# Patient Record
Sex: Male | Born: 2000 | Race: Black or African American | Hispanic: No | Marital: Single | Smoking: Never smoker
Health system: Southern US, Community
[De-identification: ages and names within clinical notes are randomized; demographics above are authoritative.]

## PROBLEM LIST (undated history)

## (undated) NOTE — *Deleted (*Deleted)
[facility] - Clinic Note  11/10/2020     CHIEF COMPLAINT Patient presents for No chief complaint on file.   HISTORY OF PRESENT ILLNESS: Keith Meyer is a 65 y.o. male who presents to the clinic today for:     Referring physician: No referring provider defined for this encounter.  HISTORICAL INFORMATION:   Selected notes from the MEDICAL RECORD NUMBER Referred by Dr. Louis Meckel for decreased vision LEE:  Ocular Hx- PMH-    CURRENT MEDICATIONS: No current outpatient medications on file. (Ophthalmic Drugs)   No current facility-administered medications for this visit. (Ophthalmic Drugs)   Current Outpatient Medications (Other)  Medication Sig  . ibuprofen (ADVIL,MOTRIN) 100 MG/5ML suspension Take 24.6 mLs (492 mg total) by mouth every 6 (six) hours as needed for fever or mild pain.   No current facility-administered medications for this visit. (Other)      REVIEW OF SYSTEMS:    ALLERGIES No Known Allergies  PAST MEDICAL HISTORY No past medical history on file. No past surgical history on file.  FAMILY HISTORY No family history on file.  SOCIAL HISTORY Social History   Tobacco Use  . Smoking status: Never Smoker  Substance Use Topics  . Alcohol use: No  . Drug use: No         OPHTHALMIC EXAM: Not recorded     IMAGING AND PROCEDURES  Imaging and Procedures for 11/10/2020           ASSESSMENT/PLAN:    ICD-10-CM   1. Retinal edema  H35.81     1.  2.  3.  Ophthalmic Meds Ordered this visit:  No orders of the defined types were placed in this encounter.      No follow-ups on file.  There are no Patient Instructions on file for this visit.   Explained the diagnoses, plan, and follow up with the patient and they expressed understanding.  Patient expressed understanding of the importance of proper follow up care.   Karie Chimera, M.D., Ph.D. Diseases & Surgery of the Retina and Vitreous [facility] @TODAY @     Abbreviations: M myopia (nearsighted); A astigmatism; H hyperopia (farsighted); P presbyopia; Mrx spectacle prescription;  CTL contact lenses; OD right eye; OS left eye; OU both eyes  XT exotropia; ET esotropia; PEK punctate epithelial keratitis; PEE punctate epithelial erosions; DES dry eye syndrome; MGD meibomian gland dysfunction; ATs artificial tears; PFAT's preservative free artificial tears; NSC nuclear sclerotic cataract; PSC posterior subcapsular cataract; ERM epi-retinal membrane; PVD posterior vitreous detachment; RD retinal detachment; DM diabetes mellitus; DR diabetic retinopathy; NPDR non-proliferative diabetic retinopathy; PDR proliferative diabetic retinopathy; CSME clinically significant macular edema; DME diabetic macular edema; dbh dot blot hemorrhages; CWS cotton wool spot; POAG primary open angle glaucoma; C/D cup-to-disc ratio; HVF humphrey visual field; GVF goldmann visual field; OCT optical coherence tomography; IOP intraocular pressure; BRVO Branch retinal vein occlusion; CRVO central retinal vein occlusion; CRAO central retinal artery occlusion; BRAO branch retinal artery occlusion; RT retinal tear; SB scleral buckle; PPV pars plana vitrectomy; VH Vitreous hemorrhage; PRP panretinal laser photocoagulation; IVK intravitreal kenalog; VMT vitreomacular traction; MH Macular hole;  NVD neovascularization of the disc; NVE neovascularization elsewhere; AREDS age related eye disease study; ARMD age related macular degeneration; POAG primary open angle glaucoma; EBMD epithelial/anterior basement membrane dystrophy; ACIOL anterior chamber intraocular lens; IOL intraocular lens; PCIOL posterior chamber intraocular lens; Phaco/IOL phacoemulsification with intraocular lens placement; PRK photorefractive keratectomy; LASIK laser assisted in situ keratomileusis;  HTN hypertension; DM diabetes mellitus; COPD chronic obstructive pulmonary disease.

---
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
## 2021-11-07 ENCOUNTER — Ambulatory Visit (HOSPITAL_COMMUNITY)
Admission: EM | Admit: 2021-11-07 | Discharge: 2021-11-07 | Disposition: A | Discharge status: Home / Self Care | Payer: Self-pay | Attending: Emergency Medicine | Admitting: Emergency Medicine

## 2021-11-07 ENCOUNTER — Encounter (HOSPITAL_COMMUNITY): Payer: Self-pay

## 2021-11-07 ENCOUNTER — Other Ambulatory Visit: Payer: Self-pay

## 2021-11-07 DIAGNOSIS — K2100 Gastro-esophageal reflux disease with esophagitis, without bleeding: Secondary | ICD-10-CM

## 2021-11-07 DIAGNOSIS — K2950 Unspecified chronic gastritis without bleeding: Secondary | ICD-10-CM

## 2021-11-07 DIAGNOSIS — J069 Acute upper respiratory infection, unspecified: Secondary | ICD-10-CM

## 2021-11-07 DIAGNOSIS — R0981 Nasal congestion: Secondary | ICD-10-CM

## 2021-11-07 LAB — POC INFLUENZA A AND B ANTIGEN (URGENT CARE ONLY)
INFLUENZA A ANTIGEN, POC: NEGATIVE
INFLUENZA B ANTIGEN, POC: NEGATIVE

## 2021-11-07 MED ORDER — LANSOPRAZOLE 30 MG PO CPDR: 30.0000 mg | DELAYED_RELEASE_CAPSULE | Freq: Every day | ORAL | 2 refills | Status: AC

## 2021-11-07 NOTE — ED Provider Notes (Signed)
[facility]    CSN: [id number] Arrival date & time: 11/07/21  1612   History   Chief Complaint Chief Complaint  Patient presents with   Nasal Congestion        HPI Keith Meyer is a 20 y.o. male. Patient complains of nasal congestion and chest congestion for the past 24 hours.  Patient states that several people in his household are also sick.  Patient also states that his lungs feel "scratchy" when he takes a deep breath.  Patient states he has experienced this sensation on and off for the past year.  Patient denies known history of allergies and asthma.  Patient also states that when he takes a very deep breath, he has significant amount of pain in his epigastric area, patient points to this area for demonstration.  Patient states is usually worse when he has large meal or eats something like pizza or goes to bed too soon after eating.  Patient states he is also noticed that at nighttime sometimes he has a cough which will wake him from sleep, states he feels like he is choking when this happens, states sometimes he needs to sit up for several hours to wait for that discomfort in his epigastric area to go away.  Patient states the cough is not productive of sputum.  Temp on arrival today is 52 F.  The history is provided by the patient.   History reviewed. No pertinent past medical history. There are no problems to display for this patient.  History reviewed. No pertinent surgical history.  Home Medications    Prior to Admission medications   Medication Sig Start Date End Date Taking? Authorizing Provider  lansoprazole (PREVACID) 30 MG capsule Take 1 capsule (30 mg total) by mouth daily with breakfast. 11/07/21 02/05/22 Yes Theadora Rama Scales, PA-C  ibuprofen (ADVIL,MOTRIN) 100 MG/5ML suspension Take 24.6 mLs (492 mg total) by mouth every 6 (six) hours as needed for fever or mild pain. 05/31/15   [name], MD   Family History History reviewed. No pertinent  family history. Social History Social History   Tobacco Use   Smoking status: Never   Smokeless tobacco: Never  Substance Use Topics   Alcohol use: No   Drug use: No    Comment: last time 2021   Allergies   Patient has no known allergies.  Review of Systems Review of Systems Pertinent findings noted in history of present illness.   Physical Exam Triage Vital Signs ED Triage Vitals  Enc Vitals Group     BP 10/23/21 0827 (!) 147/82     Pulse Rate 10/23/21 0827 72     Resp 10/23/21 0827 18     Temp 10/23/21 0827 98.3 F (36.8 C)     Temp Source 10/23/21 0827 Oral     SpO2 10/23/21 0827 98 %     Weight --      Height --      Head Circumference --      Peak Flow --      Pain Score 10/23/21 0826 5     Pain Loc --      Pain Edu? --      Excl. in GC? --    No data found.  Updated Vital Signs BP 128/72 (BP Location: Right Arm)   Pulse 91   Temp 99 F (37.2 C) (Oral)   Resp 17   SpO2 99%   Visual Acuity Right Eye Distance:   Left Eye Distance:  Bilateral Distance:    Right Eye Near:   Left Eye Near:    Bilateral Near:     Physical Exam Vitals and nursing note reviewed.  Constitutional:      General: He is not in acute distress.    Appearance: Normal appearance. He is not ill-appearing.  HENT:     Head: Normocephalic and atraumatic.     Salivary Glands: Right salivary gland is not diffusely enlarged or tender. Left salivary gland is not diffusely enlarged or tender.     Right Ear: Tympanic membrane, ear canal and external ear normal. No drainage. No middle ear effusion. There is no impacted cerumen. Tympanic membrane is not erythematous or bulging.     Left Ear: Tympanic membrane, ear canal and external ear normal. No drainage.  No middle ear effusion. There is no impacted cerumen. Tympanic membrane is not erythematous or bulging.     Nose: Nose normal. No nasal deformity, septal deviation, mucosal edema, congestion or rhinorrhea.     Right Turbinates: Not  enlarged, swollen or pale.     Left Turbinates: Not enlarged, swollen or pale.     Right Sinus: No maxillary sinus tenderness or frontal sinus tenderness.     Left Sinus: No maxillary sinus tenderness or frontal sinus tenderness.     Mouth/Throat:     Lips: Pink. No lesions.     Mouth: Mucous membranes are moist. No oral lesions.     Pharynx: Oropharynx is clear. Uvula midline. No posterior oropharyngeal erythema or uvula swelling.     Tonsils: No tonsillar exudate. 0 on the right. 0 on the left.  Eyes:     General: Lids are normal.        Right eye: No discharge.        Left eye: No discharge.     Extraocular Movements: Extraocular movements intact.     Conjunctiva/sclera: Conjunctivae normal.     Right eye: Right conjunctiva is not injected.     Left eye: Left conjunctiva is not injected.  Neck:     Trachea: Trachea and phonation normal.  Cardiovascular:     Rate and Rhythm: Normal rate and regular rhythm.     Pulses: Normal pulses.     Heart sounds: Normal heart sounds. No murmur heard.   No friction rub. No gallop.  Pulmonary:     Effort: Pulmonary effort is normal. No accessory muscle usage, prolonged expiration or respiratory distress.     Breath sounds: Normal breath sounds. No stridor, decreased air movement or transmitted upper airway sounds. No decreased breath sounds, wheezing, rhonchi or rales.  Chest:     Chest wall: No tenderness.  Abdominal:     General: Abdomen is flat. Bowel sounds are normal. There is no distension.     Palpations: Abdomen is soft.     Tenderness: There is abdominal tenderness in the epigastric area. There is no right CVA tenderness or left CVA tenderness.     Hernia: No hernia is present.  Musculoskeletal:        General: Normal range of motion.     Cervical back: Normal range of motion and neck supple. Normal range of motion.  Lymphadenopathy:     Cervical: No cervical adenopathy.  Skin:    General: Skin is warm and dry.     Findings: No  erythema or rash.  Neurological:     General: No focal deficit present.     Mental Status: He is alert and oriented to person,  place, and time.  Psychiatric:        Mood and Affect: Mood normal.        Behavior: Behavior normal.   UC Treatments / Results  Labs (all labs ordered are listed, but only abnormal results are displayed)  Labs Reviewed  POC INFLUENZA A AND B ANTIGEN (URGENT CARE ONLY)    EKG  Radiology No results found.  Procedures Procedures (including critical care time)  Medications Ordered in UC Medications - No data to display  Initial Impression / Assessment and Plan / UC Course  I have reviewed the triage vital signs and the nursing notes.  Pertinent labs & imaging results that were available during my care of the patient were reviewed by me and considered in my medical decision making (see chart for details).      Rapid flu test today is negative, patient advised.  Believe patient is suffering from undiagnosed gastroesophageal reflux disease with possible peptic ulcer disease.  I have advised him to begin taking Prevacid daily.  I have also initiated a request to help him find a primary care provider.  Patient may also be suffering from a mild upper respiratory infection, I provided him with a list of medications that he can try to treat himself symptomatically.   Final Clinical Impressions(s) / UC Diagnoses   Final diagnoses:  Nasal congestion  Chronic gastritis, presence of bleeding unspecified, unspecified gastritis type  Gastroesophageal reflux disease with esophagitis, unspecified whether hemorrhage  Viral upper respiratory tract infection     Discharge Instructions      For your chronic heartburn symptoms, please begin Prevacid once daily.  This medication works best to be taken every day and not just as needed.  The purpose of this drug to suppress acid production, allow the lining of your stomach to heal, once it is healed she should no longer  have any pain.  Recommend that you continue taking it daily for the next 3 months then you can discontinue.  If your symptoms return, then you will know that this is a medication you will need to be on long-term.  Your influenza test today is negative.  You are suffering from a viral upper respiratory infection.  Listed below are medications that you could try to help relieve your symptoms.  Please continue to avoid being around others while you are still feeling sick.  Please follow-up with your primary care provider or with urgent care in the next 3 to 5 days if you are not feeling better or if her symptoms begin to worsen.  Your symptoms are most consistent with a viral upper respiratory illness.  Conservative care is recommended at this time.  This includes rest, pushing clear fluids and activity as tolerated.  You may also noticed that your child's appetite is reduced, this is okay as long as they are drinking plenty of clear fluids.  Acetaminophen (Tylenol): This is a good fever reducer.  If there body temperature rises above 101.5 as measured with a thermometer, it is recommended that you give them 1,000 mg every 6-8 hours until they are temperature falls below 101.5, please not take more than 3,000 mg of acetaminophen either as a separate medication or as in ingredient in an over-the-counter cold/flu preparation within a 24-hour period  Ibuprofen  (Advil, Motrin): This is a good anti-inflammatory medication which addresses aches and pains and, to some degree, congestion in the nasal passages.  I recommend giving between 400 to 600 mg every 6-8 hours  as needed.  Pseudoephedrine (Sudafed): This is a decongestant.  This medication has to be purchased from the pharmacist counter, I recommend giving 2 tablets, 60 mg, 2-3 times a day as needed to relieve runny nose and sinus drainage.  Guaifenesin (Robitussin, Mucinex): This is an expectorant.  This helps break up chest congestion and loosen up thick  nasal drainage making phlegm and drainage more liquid and therefore easier to remove.  I recommend being 400 mg three times daily as needed.  Dextromethorphan (any cough medicine with the letters "DM" added to it's name such as Robitussin DM): This is a cough suppressant.  This is often recommended to be taken at nighttime to suppress cough and help children sleep.  Give dosage as directed on the bottle.   Chloraseptic Throat Spray: Spray 5 sprays into affected area every 2 hours, hold for 15 seconds and either swallow or spit it out.  This is a excellent numbing medication because it is a spray, you can put it right where you needed and so sucking on a lozenge and numbing your entire mouth.  Please follow-up within the next 3 to 5 days either with your primary care provider or urgent care if their symptoms do not resolve.  If you do not have a primary care provider, we will assist you in finding one.      ED Prescriptions     Medication Sig Dispense Auth. Provider   lansoprazole (PREVACID) 30 MG capsule Take 1 capsule (30 mg total) by mouth daily with breakfast. 30 capsule Theadora Rama Scales, PA-C      PDMP not reviewed this encounter.  Disposition Upon Discharge:  Patient presented with an acute illness with associated systemic symptoms and significant discomfort requiring urgent management. In my opinion, this is a condition that a prudent lay person (someone who possesses an average knowledge of health and medicine) may potentially expect to result in complications if not addressed urgently such as respiratory distress, impairment of bodily function or dysfunction of bodily organs.   Routine symptom specific, illness specific and/or disease specific instructions were discussed with the patient and/or caregiver at length.   As such, the patient has been evaluated and assessed, work-up was performed and treatment was provided in alignment with urgent care protocols and evidence based  medicine.  Patient/parent/caregiver has been advised that the patient may require follow up for further testing and treatment if the symptoms continue in spite of treatment, as clinically indicated and appropriate.  Patient/parent/caregiver has been advised to return to the [facility] or PCP in 14 days if no better; to PCP or the Emergency Department if new signs and symptoms develop, or if the current signs or symptoms continue to change or worsen for further workup, evaluation and treatment as clinically indicated and appropriate  The patient will follow up with their current PCP if and as advised. If the patient does not currently have a PCP we will assist them in obtaining one.   The patient may need specialty follow up if the symptoms continue, in spite of conservative treatment and management, for further workup, evaluation, consultation and treatment as clinically indicated and appropriate.  Patient/parent/caregiver verbalized understanding and agreement of plan as discussed.  All questions were addressed during visit.  Please see discharge instructions below for further details of plan.  Condition: stable for discharge home Home: take medications as prescribed; routine discharge instructions as discussed; follow up as advised.    Theadora Rama Scales, PA-C 11/09/21 1334

## 2021-11-07 NOTE — ED Triage Notes (Signed)
Pt reports nasal and chest congestion x 1 day. States lungs feels scratchy when taking a deep breath, on and off x 1 year.

## 2021-11-07 NOTE — Discharge Instructions (Addendum)
For your chronic heartburn symptoms, please begin Prevacid once daily.  This medication works best to be taken every day and not just as needed.  The purpose of this drug to suppress acid production, allow the lining of your stomach to heal, once it is healed she should no longer have any pain.  Recommend that you continue taking it daily for the next 3 months then you can discontinue.  If your symptoms return, then you will know that this is a medication you will need to be on long-term.  Your influenza test today is negative.  You are suffering from a viral upper respiratory infection.  Listed below are medications that you could try to help relieve your symptoms.  Please continue to avoid being around others while you are still feeling sick.  Please follow-up with your primary care provider or with urgent care in the next 3 to 5 days if you are not feeling better or if her symptoms begin to worsen.  Your symptoms are most consistent with a viral upper respiratory illness.  Conservative care is recommended at this time.  This includes rest, pushing clear fluids and activity as tolerated.  You may also noticed that your child's appetite is reduced, this is okay as long as they are drinking plenty of clear fluids.  Acetaminophen (Tylenol): This is a good fever reducer.  If there body temperature rises above 101.5 as measured with a thermometer, it is recommended that you give them 1,000 mg every 6-8 hours until they are temperature falls below 101.5, please not take more than 3,000 mg of acetaminophen either as a separate medication or as in ingredient in an over-the-counter cold/flu preparation within a 24-hour period  Ibuprofen  (Advil, Motrin): This is a good anti-inflammatory medication which addresses aches and pains and, to some degree, congestion in the nasal passages.  I recommend giving between 400 to 600 mg every 6-8 hours as needed.  Pseudoephedrine (Sudafed): This is a decongestant.  This  medication has to be purchased from the pharmacist counter, I recommend giving 2 tablets, 60 mg, 2-3 times a day as needed to relieve runny nose and sinus drainage.  Guaifenesin (Robitussin, Mucinex): This is an expectorant.  This helps break up chest congestion and loosen up thick nasal drainage making phlegm and drainage more liquid and therefore easier to remove.  I recommend being 400 mg three times daily as needed.  Dextromethorphan (any cough medicine with the letters "DM" added to it's name such as Robitussin DM): This is a cough suppressant.  This is often recommended to be taken at nighttime to suppress cough and help children sleep.  Give dosage as directed on the bottle.   Chloraseptic Throat Spray: Spray 5 sprays into affected area every 2 hours, hold for 15 seconds and either swallow or spit it out.  This is a excellent numbing medication because it is a spray, you can put it right where you needed and so sucking on a lozenge and numbing your entire mouth.  Please follow-up within the next 3 to 5 days either with your primary care provider or urgent care if their symptoms do not resolve.  If you do not have a primary care provider, we will assist you in finding one.

## 2022-02-10 ENCOUNTER — Emergency Department (HOSPITAL_COMMUNITY)
Admission: EM | Admit: 2022-02-10 | Discharge: 2022-02-10 | Discharge status: Against Medical Advice | Payer: Medicaid Other

## 2022-02-10 NOTE — ED Notes (Signed)
Pt did not respond when called for triage area x3

## 2024-08-14 ENCOUNTER — Encounter (HOSPITAL_BASED_OUTPATIENT_CLINIC_OR_DEPARTMENT_OTHER): Payer: Self-pay

## 2024-08-14 ENCOUNTER — Emergency Department (HOSPITAL_BASED_OUTPATIENT_CLINIC_OR_DEPARTMENT_OTHER)
Admission: EM | Admit: 2024-08-14 | Discharge: 2024-08-14 | Disposition: A | Discharge status: Home / Self Care | Attending: Emergency Medicine | Admitting: Emergency Medicine

## 2024-08-14 ENCOUNTER — Other Ambulatory Visit: Payer: Self-pay

## 2024-08-14 DIAGNOSIS — Z23 Encounter for immunization: Secondary | ICD-10-CM | POA: Diagnosis not present

## 2024-08-14 DIAGNOSIS — S99921A Unspecified injury of right foot, initial encounter: Secondary | ICD-10-CM | POA: Diagnosis present

## 2024-08-14 DIAGNOSIS — S91311A Laceration without foreign body, right foot, initial encounter: Secondary | ICD-10-CM | POA: Insufficient documentation

## 2024-08-14 DIAGNOSIS — W268XXA Contact with other sharp object(s), not elsewhere classified, initial encounter: Secondary | ICD-10-CM | POA: Insufficient documentation

## 2024-08-14 MED ORDER — TETANUS-DIPHTH-ACELL PERTUSSIS 5-2.5-18.5 LF-MCG/0.5 IM SUSY
0.5000 mL | PREFILLED_SYRINGE | Freq: Once | INTRAMUSCULAR | Status: AC
  Administered 2024-08-14: 0.5 mL via INTRAMUSCULAR
  Filled 2024-08-14: qty 0.5

## 2024-08-14 NOTE — ED Notes (Signed)
 Lac dermabonded by EDP, after same it was covered with nonadherant dressing and secured.  Awaiting DC

## 2024-08-14 NOTE — ED Triage Notes (Signed)
~   2cm laceration on bottom of right foot after accidentally stepping on a metal strip of carpet.   Bleeding controlled.   Tetanus unknown.

## 2024-08-14 NOTE — ED Provider Notes (Signed)
 [facility]  Provider Note  CSN: [id number] Arrival date & time: 08/14/24 0025  History Chief Complaint  Patient presents with   Laceration    Keith Meyer is a 23 y.o. male reports he stepped on the metal nailing strip from a carpet a short time prior to arrival sustaining a laceration to the sole of his R foot. Unsure of TDAP.    Home Medications Prior to Admission medications   Medication Sig Start Date End Date Taking? Authorizing Provider  ibuprofen  (ADVIL ,MOTRIN ) 100 MG/5ML suspension Take 24.6 mLs (492 mg total) by mouth every 6 (six) hours as needed for fever or mild pain. 05/31/15   Rhae Lye, MD  lansoprazole  (PREVACID ) 30 MG capsule Take 1 capsule (30 mg total) by mouth daily with breakfast. 11/07/21 02/05/22  Joesph Shaver Scales, PA-C     Allergies    Patient has no known allergies.   Review of Systems   Review of Systems Please see HPI for pertinent positives and negatives  Physical Exam BP 138/67   Pulse 62   Temp 98.2 F (36.8 C)   Resp 19   Ht 6' 2 (1.88 m)   Wt 79.4 kg   SpO2 100%   BMI 22.47 kg/m   Physical Exam Vitals and nursing note reviewed.  HENT:     Head: Normocephalic.     Nose: Nose normal.  Eyes:     Extraocular Movements: Extraocular movements intact.  Pulmonary:     Effort: Pulmonary effort is normal.  Musculoskeletal:        General: Normal range of motion.     Cervical back: Neck supple.  Skin:    Findings: No rash (on exposed skin).     Comments: 2 cm superficial laceration through the epidermis of the sole of R foot  Neurological:     Mental Status: He is alert and oriented to person, place, and time.  Psychiatric:        Mood and Affect: Mood normal.     ED Results / Procedures / Treatments   EKG None  Procedures .Laceration Repair  Date/Time: 08/14/2024 1:01 AM  Performed by: Roselyn Carlin NOVAK, MD Authorized by: Roselyn Carlin NOVAK, MD   Consent:     Consent obtained:  Verbal   Consent given by:  Patient Laceration details:    Location:  Foot   Foot location:  Sole of R foot   Length (cm):  2 Treatment:    Area cleansed with:  Saline Skin repair:    Repair method:  Tissue adhesive Approximation:    Approximation:  Close Repair type:    Repair type:  Simple Post-procedure details:    Dressing:  Non-adherent dressing   Procedure completion:  Tolerated well, no immediate complications   Medications Ordered in the ED Medications  Tdap (BOOSTRIX ) injection 0.5 mL (0.5 mLs Intramuscular Given 08/14/24 0052)    Initial Impression and Plan  Patient here with superficial laceration of foot, repaired with dermabond as above. TDAP updated. Wound care instructions given.   ED Course       MDM Rules/Calculators/A&P Medical Decision Making Problems Addressed: Laceration of right foot, initial encounter: acute illness or injury  Risk Prescription drug management.     Final Clinical Impression(s) / ED Diagnoses Final diagnoses:  Laceration of right foot, initial encounter    Rx / DC Orders ED Discharge Orders     None        Roselyn Carlin  B, MD 08/14/24 9897
